# Patient Record
Sex: Female | Born: 2000
Health system: Southern US, Community
[De-identification: ages and names within clinical notes are randomized; demographics above are authoritative.]

---
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
## 2021-03-16 ENCOUNTER — Ambulatory Visit (HOSPITAL_COMMUNITY)
Admission: EM | Admit: 2021-03-16 | Discharge: 2021-03-16 | Disposition: A | Discharge status: Home / Self Care | Payer: 59 | Attending: Family Medicine | Admitting: Family Medicine

## 2021-03-16 ENCOUNTER — Other Ambulatory Visit: Payer: Self-pay

## 2021-03-16 ENCOUNTER — Encounter (HOSPITAL_COMMUNITY): Payer: Self-pay

## 2021-03-16 DIAGNOSIS — S40021A Contusion of right upper arm, initial encounter: Secondary | ICD-10-CM

## 2021-03-16 DIAGNOSIS — S39012A Strain of muscle, fascia and tendon of lower back, initial encounter: Secondary | ICD-10-CM | POA: Diagnosis not present

## 2021-03-16 MED ORDER — CYCLOBENZAPRINE HCL 5 MG PO TABS: 5.0000 mg | ORAL_TABLET | Freq: Three times a day (TID) | ORAL | 0 refills | Status: AC | PRN

## 2021-03-16 MED ORDER — NAPROXEN 500 MG PO TABS: 500.0000 mg | ORAL_TABLET | Freq: Two times a day (BID) | ORAL | 0 refills | Status: AC | PRN
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
## 2021-03-16 NOTE — ED Notes (Signed)
Called to phone number no answered. Called in lobby no answered.
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
